# Patient Record
Sex: Female | Born: 1960 | Hispanic: No | Marital: Single
Health system: Southern US, Community
[De-identification: ages and names within clinical notes are randomized; demographics above are authoritative.]

---

## 2021-05-24 ENCOUNTER — Emergency Department (HOSPITAL_COMMUNITY): Payer: Self-pay

## 2021-05-24 ENCOUNTER — Emergency Department (HOSPITAL_COMMUNITY)
Admission: EM | Admit: 2021-05-24 | Discharge: 2021-05-24 | Disposition: A | Discharge status: Home / Self Care | Payer: Self-pay | Attending: Emergency Medicine | Admitting: Emergency Medicine

## 2021-05-24 ENCOUNTER — Other Ambulatory Visit: Payer: Self-pay

## 2021-05-24 DIAGNOSIS — Z20822 Contact with and (suspected) exposure to covid-19: Secondary | ICD-10-CM | POA: Insufficient documentation

## 2021-05-24 DIAGNOSIS — N390 Urinary tract infection, site not specified: Secondary | ICD-10-CM

## 2021-05-24 DIAGNOSIS — N3090 Cystitis, unspecified without hematuria: Secondary | ICD-10-CM | POA: Insufficient documentation

## 2021-05-24 LAB — CBC
HCT: 47.3 % — ABNORMAL HIGH (ref 36.0–46.0)
Hemoglobin: 16.1 g/dL — ABNORMAL HIGH (ref 12.0–15.0)
MCH: 29.8 pg (ref 26.0–34.0)
MCHC: 34 g/dL (ref 30.0–36.0)
MCV: 87.4 fL (ref 80.0–100.0)
Platelets: 268 10*3/uL (ref 150–400)
RBC: 5.41 MIL/uL — ABNORMAL HIGH (ref 3.87–5.11)
RDW: 13.4 % (ref 11.5–15.5)
WBC: 8.8 10*3/uL (ref 4.0–10.5)
nRBC: 0 % (ref 0.0–0.2)

## 2021-05-24 LAB — COMPREHENSIVE METABOLIC PANEL
ALT: 41 U/L (ref 0–44)
AST: 27 U/L (ref 15–41)
Albumin: 3.8 g/dL (ref 3.5–5.0)
Alkaline Phosphatase: 77 U/L (ref 38–126)
Anion gap: 10 (ref 5–15)
BUN: 15 mg/dL (ref 8–23)
CO2: 28 mmol/L (ref 22–32)
Calcium: 8.8 mg/dL — ABNORMAL LOW (ref 8.9–10.3)
Chloride: 104 mmol/L (ref 98–111)
Creatinine, Ser: 1.08 mg/dL — ABNORMAL HIGH (ref 0.44–1.00)
GFR, Estimated: 58 mL/min — ABNORMAL LOW (ref >60)
Glucose, Bld: 102 mg/dL — ABNORMAL HIGH (ref 70–99)
Potassium: 3.8 mmol/L (ref 3.5–5.1)
Sodium: 142 mmol/L (ref 135–145)
Total Bilirubin: 0.9 mg/dL (ref 0.3–1.2)
Total Protein: 7.4 g/dL (ref 6.5–8.1)

## 2021-05-24 LAB — URINALYSIS, ROUTINE W REFLEX MICROSCOPIC
Bilirubin Urine: NEGATIVE
Glucose, UA: NEGATIVE mg/dL
Hgb urine dipstick: NEGATIVE
Ketones, ur: NEGATIVE mg/dL
Nitrite: POSITIVE — AB
Protein, ur: NEGATIVE mg/dL
Specific Gravity, Urine: 1.02 (ref 1.005–1.030)
pH: 7 (ref 5.0–8.0)

## 2021-05-24 LAB — RESP PANEL BY RT-PCR (FLU A&B, COVID) ARPGX2
Influenza A by PCR: NEGATIVE
Influenza B by PCR: NEGATIVE
SARS Coronavirus 2 by RT PCR: NEGATIVE

## 2021-05-24 LAB — LIPASE, BLOOD: Lipase: 24 U/L (ref 11–51)

## 2021-05-24 MED ORDER — SODIUM CHLORIDE 0.9 % IV BOLUS
1000.0000 mL | Freq: Once | INTRAVENOUS | Status: AC
  Administered 2021-05-24: 1000 mL via INTRAVENOUS

## 2021-05-24 MED ORDER — CEPHALEXIN 500 MG PO CAPS: 500.0000 mg | ORAL_CAPSULE | Freq: Four times a day (QID) | ORAL | 0 refills | Status: AC

## 2021-05-24 MED ORDER — ONDANSETRON HCL 4 MG/2ML IJ SOLN
4.0000 mg | Freq: Once | INTRAMUSCULAR | Status: AC
  Administered 2021-05-24: 4 mg via INTRAVENOUS
  Filled 2021-05-24: qty 2

## 2021-05-24 MED ORDER — ONDANSETRON 4 MG PO TBDP: 4.0000 mg | ORAL_TABLET | Freq: Three times a day (TID) | ORAL | 0 refills | Status: AC | PRN

## 2021-05-24 MED ORDER — DIPHENHYDRAMINE HCL 50 MG/ML IJ SOLN
25.0000 mg | Freq: Once | INTRAMUSCULAR | Status: AC
  Administered 2021-05-24: 25 mg via INTRAVENOUS
  Filled 2021-05-24: qty 1

## 2021-05-24 MED ORDER — HYDROMORPHONE HCL 1 MG/ML IJ SOLN
1.0000 mg | Freq: Once | INTRAMUSCULAR | Status: AC
  Administered 2021-05-24: 1 mg via INTRAVENOUS
  Filled 2021-05-24: qty 1

## 2021-05-24 MED ORDER — IOHEXOL 300 MG/ML  SOLN
100.0000 mL | Freq: Once | INTRAMUSCULAR | Status: AC | PRN
  Administered 2021-05-24: 100 mL via INTRAVENOUS

## 2021-05-24 MED ORDER — METOCLOPRAMIDE HCL 5 MG/ML IJ SOLN
10.0000 mg | Freq: Once | INTRAMUSCULAR | Status: AC
  Administered 2021-05-24: 10 mg via INTRAVENOUS
  Filled 2021-05-24: qty 2

## 2021-05-24 NOTE — ED Notes (Signed)
Patient verbalizes understanding of discharge instructions. Opportunity for questioning and answers were provided. Armband removed by staff, pt discharged from ED to home via POV  

## 2021-05-24 NOTE — ED Triage Notes (Signed)
Pt presents for N/v/D and HA since Thursday. Was given medication for nausea, was not tested for flu or covid at that time. Denies vision changes, blood in vomit or stool, SOB, CP. Works at a group home.  H/o htn. Has not taken any medication today, takes BP medication normally but unable to keep anything down

## 2021-05-24 NOTE — ED Notes (Signed)
Aware of need for urine sample, specimen cup at bedside, aware of location of restroom, call button within reach

## 2021-05-24 NOTE — ED Provider Notes (Signed)
[facility] EMERGENCY DEPARTMENT Provider Note   CSN: [id number] Arrival date & time: 05/24/21  1110     History Chief Complaint  Patient presents with   Nausea    Megan Dudley is a 60 y.o. female.  The history is provided by the patient. No language interpreter was used.  Abdominal Pain Pain location:  Generalized Pain quality: aching   Pain radiates to:  Does not radiate Pain severity:  Moderate Onset quality:  Gradual Duration:  2 days Timing:  Constant Progression:  Worsening Chronicity:  New Context: not sick contacts   Relieved by:  Nothing Worsened by:  Nothing Ineffective treatments:  None tried Associated symptoms: nausea and vomiting   Associated symptoms: no fever   Risk factors: multiple surgeries   Pt reports she has a history of bowel obstructions second to abdominal surgery.  Pt has a history of scarring and keloids     No past medical history on file.  There are no problems to display for this patient.      OB History   No obstetric history on file.     No family history on file.     Home Medications Prior to Admission medications   Medication Sig Start Date End Date Taking? Authorizing Provider  cephALEXin (KEFLEX) 500 MG capsule Take 1 capsule (500 mg total) by mouth 4 (four) times daily for 10 days. 05/24/21 06/03/21 Yes Cheron Schaumann K, PA-C  ondansetron (ZOFRAN ODT) 4 MG disintegrating tablet Take 1 tablet (4 mg total) by mouth every 8 (eight) hours as needed for nausea or vomiting. 05/24/21  Yes Elson Areas, PA-C    Allergies    Patient has no allergy information on record.  Review of Systems   Review of Systems  Constitutional:  Negative for fever.  Gastrointestinal:  Positive for abdominal pain, nausea and vomiting.  All other systems reviewed and are negative.  Physical Exam Updated Vital Signs BP (!) 147/94   Pulse 72   Temp 98.8 F (37.1 C) (Oral)   Resp 13   Ht 5\' 3"  (1.6 m)   Wt 86.2 kg   SpO2 91%   BMI  33.66 kg/m   Physical Exam Vitals and nursing note reviewed.  Constitutional:      Appearance: She is well-developed.  HENT:     Head: Normocephalic.     Mouth/Throat:     Mouth: Mucous membranes are moist.  Cardiovascular:     Rate and Rhythm: Normal rate.  Pulmonary:     Effort: Pulmonary effort is normal.  Abdominal:     General: There is no distension.     Palpations: Abdomen is soft.     Tenderness: There is abdominal tenderness.  Musculoskeletal:        General: Normal range of motion.     Cervical back: Normal range of motion.  Neurological:     General: No focal deficit present.     Mental Status: She is alert and oriented to person, place, and time.  Psychiatric:        Mood and Affect: Mood normal.    ED Results / Procedures / Treatments   Labs (all labs ordered are listed, but only abnormal results are displayed) Labs Reviewed  COMPREHENSIVE METABOLIC PANEL - Abnormal; Notable for the following components:      Result Value   Glucose, Bld 102 (*)    Creatinine, Ser 1.08 (*)    Calcium 8.8 (*)    GFR, Estimated 58 (*)  All other components within normal limits  CBC - Abnormal; Notable for the following components:   RBC 5.41 (*)    Hemoglobin 16.1 (*)    HCT 47.3 (*)    All other components within normal limits  URINALYSIS, ROUTINE W REFLEX MICROSCOPIC - Abnormal; Notable for the following components:   APPearance HAZY (*)    Nitrite POSITIVE (*)    Leukocytes,Ua SMALL (*)    Bacteria, UA MANY (*)    All other components within normal limits  RESP PANEL BY RT-PCR (FLU A&B, COVID) [id number]  URINE CULTURE  LIPASE, BLOOD    EKG None  Radiology CT ABDOMEN PELVIS W CONTRAST  Result Date: 05/24/2021 CLINICAL DATA:  Acute abdominal pain. Nausea and vomiting. Diarrhea. EXAM: CT ABDOMEN AND PELVIS WITH CONTRAST TECHNIQUE: Multidetector CT imaging of the abdomen and pelvis was performed using the standard protocol following bolus administration of  intravenous contrast. CONTRAST:  115mL OMNIPAQUE IOHEXOL 300 MG/ML  SOLN COMPARISON:  None. FINDINGS: Lower Chest: No acute findings. Hepatobiliary: Probable tiny sub-cm cyst noted in left hepatic lobe. No hepatic masses identified. Gallbladder is unremarkable. No evidence of biliary ductal dilatation. Pancreas:  No mass or inflammatory changes. Spleen: Within normal limits in size and appearance. Adrenals/Urinary Tract: No masses identified. No evidence of ureteral calculi or hydronephrosis. Stomach/Bowel: No evidence of obstruction, inflammatory process or abnormal fluid collections. Vascular/Lymphatic: No pathologically enlarged lymph nodes. No acute vascular findings. Aortic atherosclerotic calcification noted. Reproductive: Prior hysterectomy noted. Adnexal regions are unremarkable in appearance. Other:  None. Musculoskeletal:  No suspicious bone lesions identified. IMPRESSION: No acute findings within the abdomen or pelvis. Aortic Atherosclerosis (ICD10-I70.0). Electronically Signed   By: Marlaine Hind M.D.   On: 05/24/2021 16:18    Procedures Procedures   Medications Ordered in ED Medications  sodium chloride 0.9 % bolus 1,000 mL (0 mLs Intravenous Stopped 05/24/21 1519)  HYDROmorphone (DILAUDID) injection 1 mg (1 mg Intravenous Given 05/24/21 1337)  ondansetron (ZOFRAN) injection 4 mg (4 mg Intravenous Given 05/24/21 1331)  iohexol (OMNIPAQUE) 300 MG/ML solution 100 mL (100 mLs Intravenous Contrast Given 05/24/21 1501)  metoCLOPramide (REGLAN) injection 10 mg (10 mg Intravenous Given 05/24/21 1528)  diphenhydrAMINE (BENADRYL) injection 25 mg (25 mg Intravenous Given 05/24/21 1528)    ED Course  I have reviewed the triage vital signs and the nursing notes.  Pertinent labs & imaging results that were available during my care of the patient were reviewed by me and considered in my medical decision making (see chart for details).    MDM Rules/Calculators/A&P                           MDM:  Pt  given iv fluids and zofran.  Pt had continued vomiting.  Pt given reglan and benadryl.  Pt had decreased vomiting.  Ct scan no bowel blockage.  Ua shows 11-20 wbc and many bacteria  Pt counseled on results and follow up Final Clinical Impression(s) / ED Diagnoses Final diagnoses:  Urinary tract infection without hematuria, site unspecified    Rx / DC Orders ED Discharge Orders          Ordered    cephALEXin (KEFLEX) 500 MG capsule  4 times daily        05/24/21 1722    ondansetron (ZOFRAN ODT) 4 MG disintegrating tablet  Every 8 hours PRN        05/24/21 1722  An After Visit Summary was printed and given to the patient.    Fransico Meadow, [place] 05/24/21 1727    Milton Ferguson, MD 05/27/21 1704

## 2021-05-24 NOTE — Discharge Instructions (Addendum)
Return if any problems.

## 2021-05-27 LAB — URINE CULTURE: Culture: >=100000 — AB

## 2021-12-24 DIAGNOSIS — R69 Illness, unspecified: Secondary | ICD-10-CM | POA: Diagnosis not present

## 2021-12-24 DIAGNOSIS — F329 Major depressive disorder, single episode, unspecified: Secondary | ICD-10-CM | POA: Diagnosis not present

## 2022-01-27 ENCOUNTER — Other Ambulatory Visit (HOSPITAL_COMMUNITY): Payer: Self-pay | Admitting: Family Medicine

## 2022-01-27 DIAGNOSIS — R110 Nausea: Secondary | ICD-10-CM | POA: Diagnosis not present

## 2022-01-27 DIAGNOSIS — Z1231 Encounter for screening mammogram for malignant neoplasm of breast: Secondary | ICD-10-CM | POA: Diagnosis not present

## 2022-01-27 DIAGNOSIS — E669 Obesity, unspecified: Secondary | ICD-10-CM | POA: Diagnosis not present

## 2022-01-27 DIAGNOSIS — Z1211 Encounter for screening for malignant neoplasm of colon: Secondary | ICD-10-CM | POA: Diagnosis not present

## 2022-01-27 DIAGNOSIS — Z713 Dietary counseling and surveillance: Secondary | ICD-10-CM | POA: Diagnosis not present

## 2022-01-27 DIAGNOSIS — L989 Disorder of the skin and subcutaneous tissue, unspecified: Secondary | ICD-10-CM | POA: Diagnosis not present

## 2022-01-27 DIAGNOSIS — I10 Essential (primary) hypertension: Secondary | ICD-10-CM | POA: Diagnosis not present

## 2022-01-28 ENCOUNTER — Encounter (INDEPENDENT_AMBULATORY_CARE_PROVIDER_SITE_OTHER): Payer: Self-pay | Admitting: *Deleted

## 2022-02-23 DIAGNOSIS — M25562 Pain in left knee: Secondary | ICD-10-CM | POA: Diagnosis not present

## 2022-02-23 DIAGNOSIS — I10 Essential (primary) hypertension: Secondary | ICD-10-CM | POA: Diagnosis not present

## 2022-02-23 DIAGNOSIS — W19XXXA Unspecified fall, initial encounter: Secondary | ICD-10-CM | POA: Diagnosis not present

## 2022-02-23 DIAGNOSIS — M25511 Pain in right shoulder: Secondary | ICD-10-CM | POA: Diagnosis not present

## 2022-02-24 DIAGNOSIS — Z124 Encounter for screening for malignant neoplasm of cervix: Secondary | ICD-10-CM | POA: Diagnosis not present

## 2022-02-25 DIAGNOSIS — R079 Chest pain, unspecified: Secondary | ICD-10-CM | POA: Diagnosis not present

## 2022-02-25 DIAGNOSIS — I674 Hypertensive encephalopathy: Secondary | ICD-10-CM | POA: Diagnosis not present

## 2022-02-25 DIAGNOSIS — R519 Headache, unspecified: Secondary | ICD-10-CM | POA: Diagnosis not present

## 2022-02-25 DIAGNOSIS — I10 Essential (primary) hypertension: Secondary | ICD-10-CM | POA: Diagnosis not present

## 2022-03-30 ENCOUNTER — Ambulatory Visit (INDEPENDENT_AMBULATORY_CARE_PROVIDER_SITE_OTHER): Payer: 59 | Admitting: Gastroenterology

## 2022-03-30 ENCOUNTER — Encounter (INDEPENDENT_AMBULATORY_CARE_PROVIDER_SITE_OTHER): Payer: Self-pay | Admitting: *Deleted

## 2022-03-30 ENCOUNTER — Encounter (INDEPENDENT_AMBULATORY_CARE_PROVIDER_SITE_OTHER): Payer: Self-pay | Admitting: Gastroenterology

## 2022-04-13 DIAGNOSIS — Z6834 Body mass index (BMI) 34.0-34.9, adult: Secondary | ICD-10-CM | POA: Diagnosis not present

## 2022-04-13 DIAGNOSIS — Z713 Dietary counseling and surveillance: Secondary | ICD-10-CM | POA: Diagnosis not present

## 2022-04-13 DIAGNOSIS — I10 Essential (primary) hypertension: Secondary | ICD-10-CM | POA: Diagnosis not present

## 2023-08-24 IMAGING — CT CT ABD-PELV W/ CM
2 of 5 series · 17 of 46 positions shown, 19 images · IV contrast (Omnipaque or Isovue)
Comparison: None.

CLINICAL DATA: Acute abdominal pain. Nausea and vomiting. Diarrhea.

EXAM:
CT ABDOMEN AND PELVIS WITH CONTRAST
TECHNIQUE: Multidetector CT imaging of the abdomen and pelvis was performed
using the standard protocol following bolus administration of
intravenous contrast.
CONTRAST:  100mL OMNIPAQUE IOHEXOL 300 MG/ML  SOLN

[Series 2: axial st · axial · 0.98mm/px · z∈[-774,-384]mm · 14 of 88 slices shown, 16 images]
[im 5/88  soft-tissue]
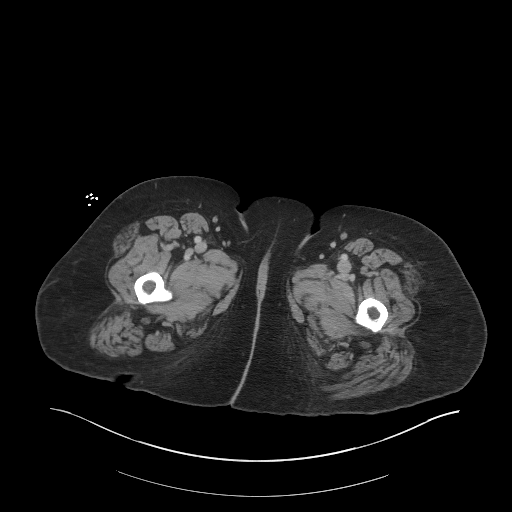
[im 5/88  bone]
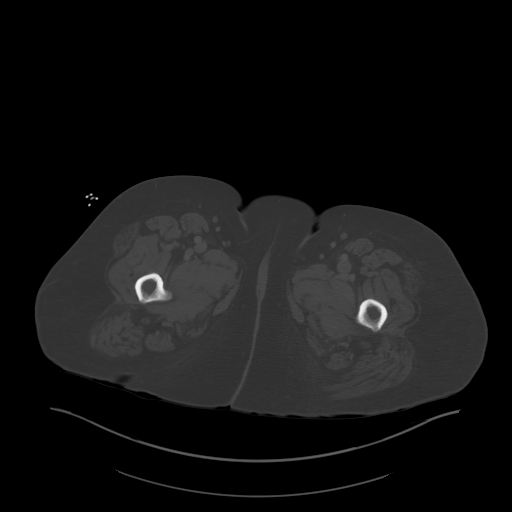
[im 10/88  soft-tissue]
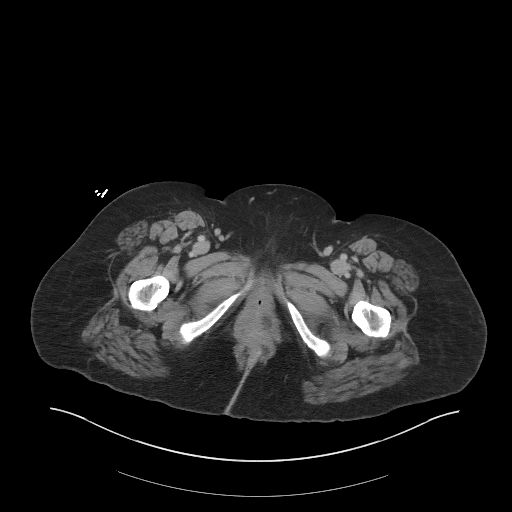
[im 20/88  soft-tissue]
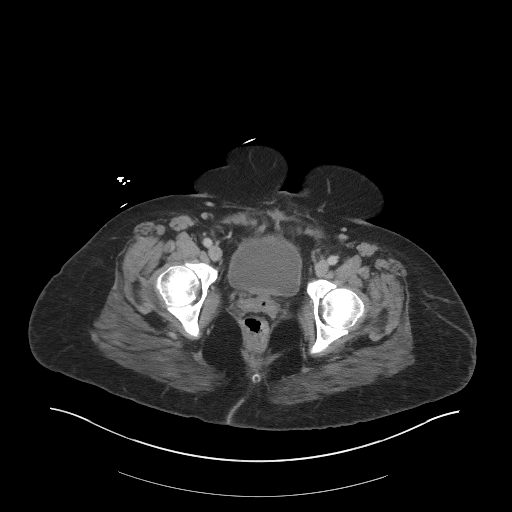
[im 25/88  soft-tissue]
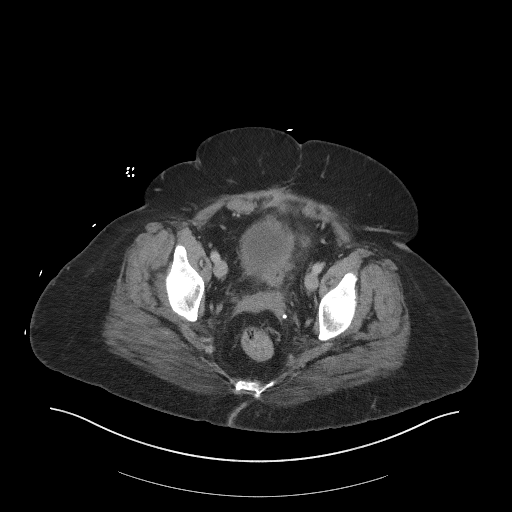
[im 30/88  soft-tissue]
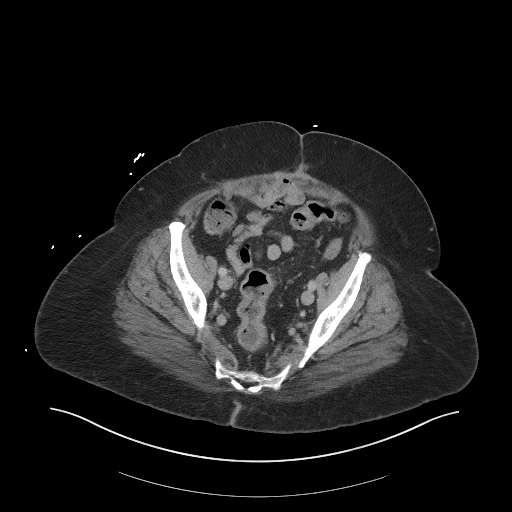
[im 34/88  soft-tissue]
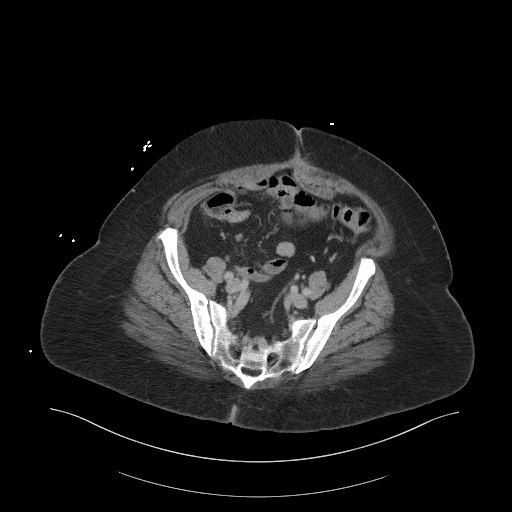
[im 39/88  soft-tissue]
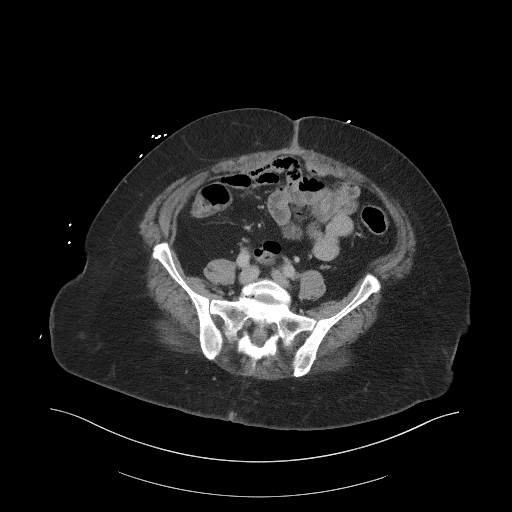
[im 49/88  soft-tissue]
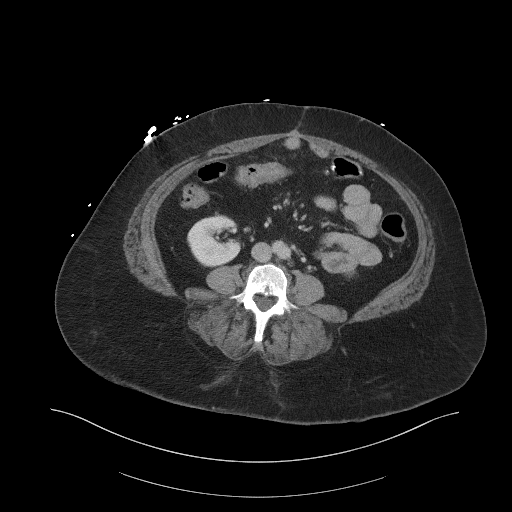
[im 54/88  soft-tissue]
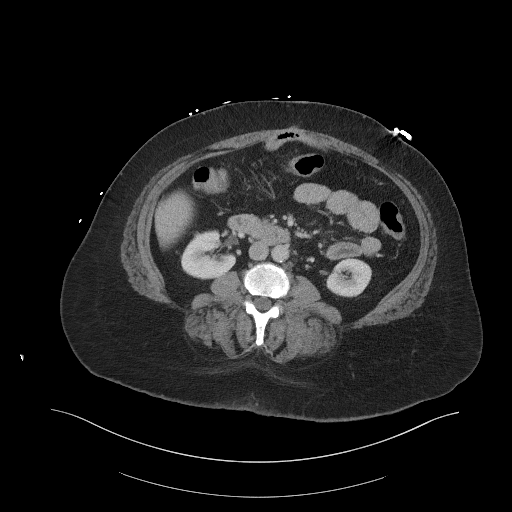
[im 54/88  bone]
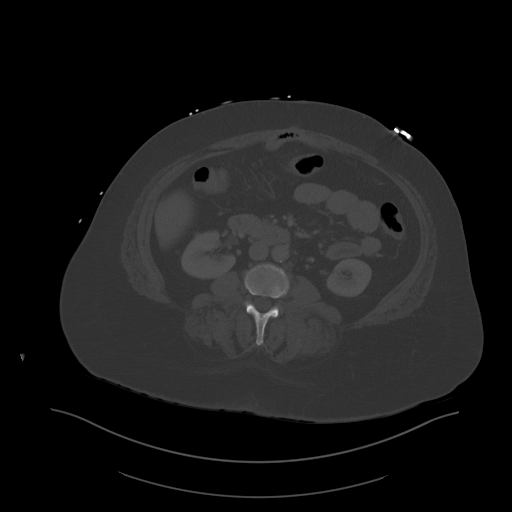
[im 59/88  soft-tissue]
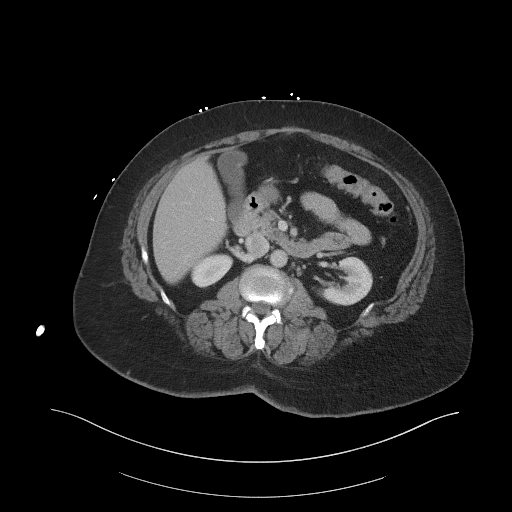
[im 63/88  soft-tissue]
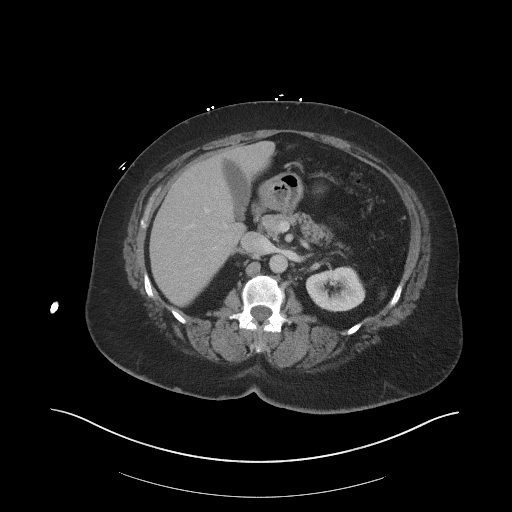
[im 68/88  soft-tissue]
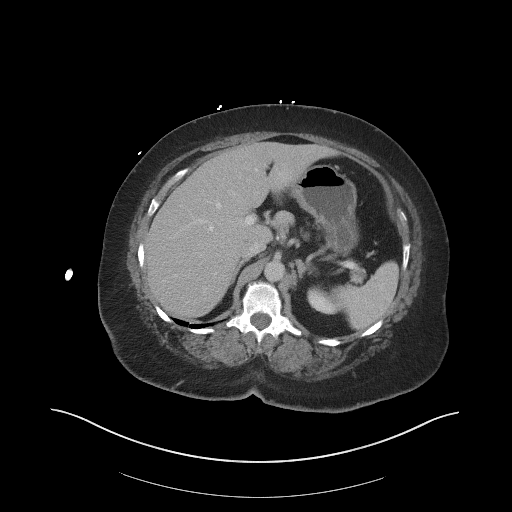
[im 78/88  soft-tissue]
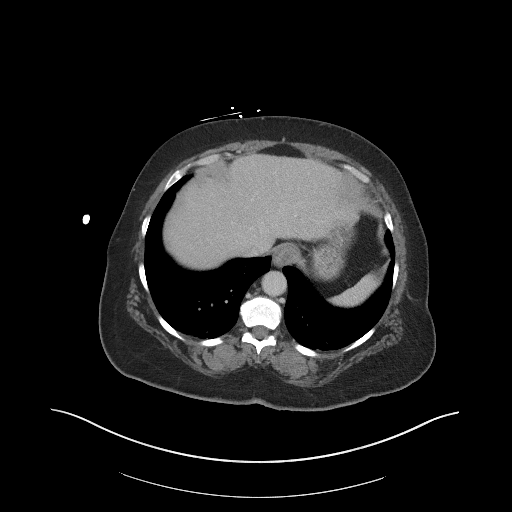
[im 83/88  soft-tissue]
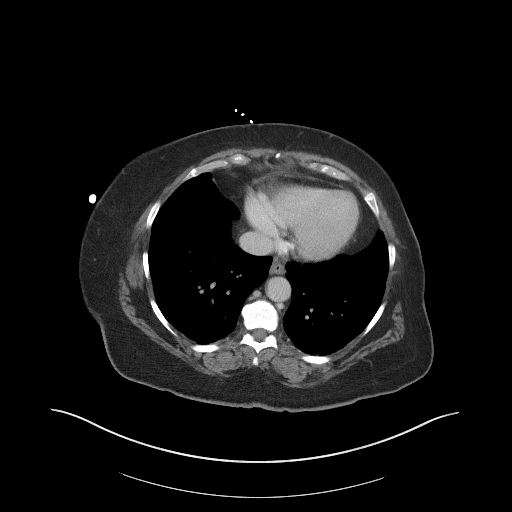

[Series 5: coronal st · coronal · 0.77mm/px · 3 of 111 slices shown]
[im 37/111  soft-tissue]
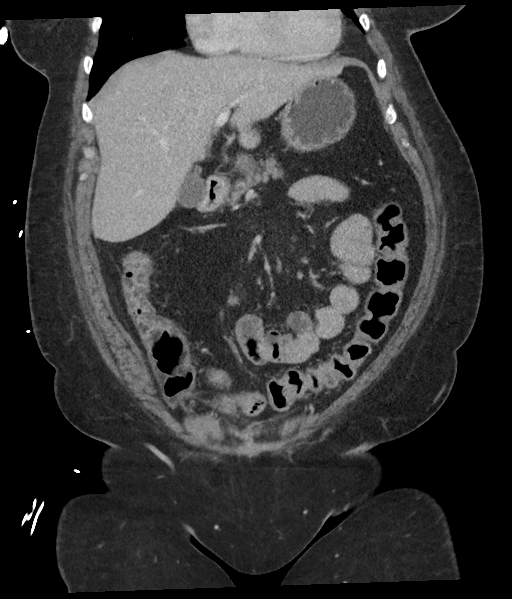
[im 49/111  soft-tissue]
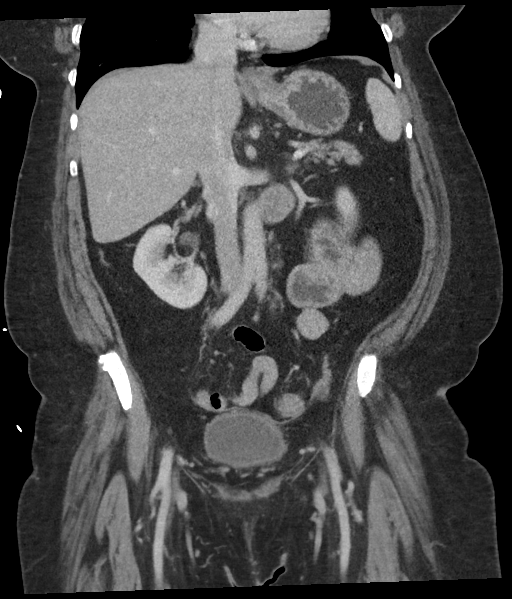
[im 62/111  soft-tissue]
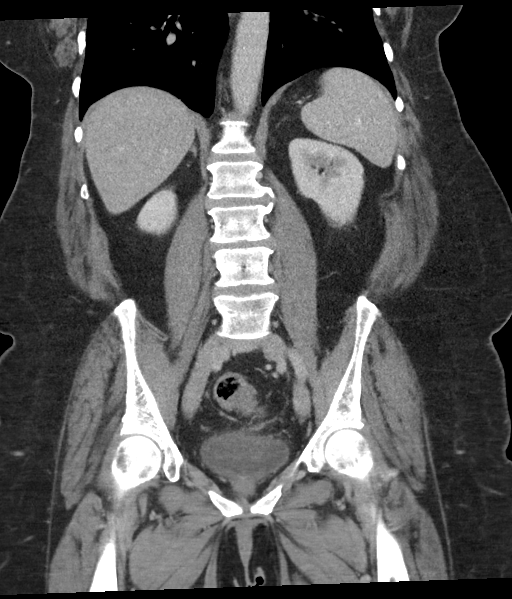

[17 of 46 positions shown; findings below may reference images not displayed]

FINDINGS: Lower Chest: No acute findings.

Hepatobiliary: Probable tiny sub-cm cyst noted in left hepatic lobe.
No hepatic masses identified. Gallbladder is unremarkable. No
evidence of biliary ductal dilatation.

Pancreas:  No mass or inflammatory changes.

Spleen: Within normal limits in size and appearance.

Adrenals/Urinary Tract: No masses identified. No evidence of
ureteral calculi or hydronephrosis.

Stomach/Bowel: No evidence of obstruction, inflammatory process or
abnormal fluid collections.

Vascular/Lymphatic: No pathologically enlarged lymph nodes. No acute
vascular findings. Aortic atherosclerotic calcification noted.

Reproductive: Prior hysterectomy noted. Adnexal regions are
unremarkable in appearance.

Other:  None.

Musculoskeletal:  No suspicious bone lesions identified.
IMPRESSION: No acute findings within the abdomen or pelvis.

Aortic Atherosclerosis ([id number]).
